# Patient Record
Sex: Female | Born: 1960 | Race: White | Hispanic: No | Marital: Married | Smoking: Never smoker
Health system: Southern US, Community
[De-identification: ages and names within clinical notes are randomized; demographics above are authoritative.]

## PROBLEM LIST (undated history)

## (undated) DIAGNOSIS — I10 Essential (primary) hypertension: Secondary | ICD-10-CM

---

## 2013-07-13 DIAGNOSIS — E669 Obesity, unspecified: Secondary | ICD-10-CM | POA: Insufficient documentation

## 2013-10-12 ENCOUNTER — Ambulatory Visit: Payer: Self-pay | Admitting: Gastroenterology

## 2013-12-28 ENCOUNTER — Ambulatory Visit: Payer: Self-pay | Admitting: General Practice

## 2014-12-08 DIAGNOSIS — E7800 Pure hypercholesterolemia, unspecified: Secondary | ICD-10-CM | POA: Insufficient documentation

## 2015-04-15 ENCOUNTER — Ambulatory Visit: Payer: Self-pay | Admitting: Physician Assistant

## 2015-04-15 ENCOUNTER — Encounter: Payer: Self-pay | Admitting: Physician Assistant

## 2015-04-15 VITALS — BP 140/90 | HR 87 | Temp 98.4°F

## 2015-04-15 DIAGNOSIS — J0120 Acute ethmoidal sinusitis, unspecified: Secondary | ICD-10-CM

## 2015-04-15 MED ORDER — PROMETHAZINE-DM 6.25-15 MG/5ML PO SYRP: 5.0000 mL | ORAL_SOLUTION | Freq: Four times a day (QID) | ORAL | Status: DC | PRN

## 2015-04-15 MED ORDER — SULFAMETHOXAZOLE-TRIMETHOPRIM 800-160 MG PO TABS: 1.0000 | ORAL_TABLET | Freq: Two times a day (BID) | ORAL | Status: DC

## 2015-04-15 NOTE — Progress Notes (Signed)
S. Patient c/o one week of sinus congestion, post nasal drainage, and non-productive cough.  Denies Fever/chill, states nausea adn vomiting. No diarrhea. O.  No acute distress,HEENT remarkable for maxillary sinus guarding, edematous nasal turbinates, and post nasal drainage. Non-productive cough. Neck supple, Lungs CTA, and heart RRR. A.  Maxillary sinusitis P.  Bactrim DS, and Phenergan DM. F/U PRN.

## 2015-05-13 ENCOUNTER — Ambulatory Visit: Payer: Self-pay | Admitting: Physician Assistant

## 2015-05-13 ENCOUNTER — Encounter: Payer: Self-pay | Admitting: Physician Assistant

## 2015-05-13 VITALS — BP 119/80 | HR 73 | Temp 98.0°F

## 2015-05-13 DIAGNOSIS — J069 Acute upper respiratory infection, unspecified: Secondary | ICD-10-CM

## 2015-05-13 MED ORDER — METHYLPREDNISOLONE 4 MG PO TBPK: ORAL_TABLET | ORAL | Status: DC

## 2015-05-13 MED ORDER — LEVOFLOXACIN 500 MG PO TABS: 500.0000 mg | ORAL_TABLET | Freq: Every day | ORAL | Status: DC

## 2015-05-13 NOTE — Progress Notes (Signed)
S: c/o continued cough and cold sx, was getting better but has relapsed, sick since before xmas, given septra then amoxil, zpacks don't usually work for her, denies fever/chills, cp/sob  O: vitals wnl, nad, tms dull b/l, nasal mucosa red and swollen, throat wnl, neck supple no lymph, lungs c t a, cv rrr, cough is dry and hacking  A: acute uri  P: levaquin 500mg  qd x 10d, medrol dose pack

## 2015-05-18 NOTE — Progress Notes (Signed)
Patient ID: Amanda Rowland, female   DOB: 02-29-1961, 55 y.o.   MRN: [id number] Patient called and express that she may be having an allergic reaction to Levaquin.  She said she developed red rashes all over he body.  Per Susan's order she was told to discontinue the medication. I added the medication to her list of allergies.

## 2016-01-19 ENCOUNTER — Ambulatory Visit: Payer: Self-pay | Admitting: Physician Assistant

## 2016-02-24 ENCOUNTER — Ambulatory Visit: Payer: Self-pay | Admitting: Physician Assistant

## 2016-02-24 ENCOUNTER — Encounter: Payer: Self-pay | Admitting: Physician Assistant

## 2016-02-24 VITALS — BP 120/80 | HR 64 | Temp 97.7°F | Ht 60.0 in | Wt 201.0 lb

## 2016-02-24 DIAGNOSIS — I10 Essential (primary) hypertension: Secondary | ICD-10-CM | POA: Insufficient documentation

## 2016-02-24 DIAGNOSIS — E6609 Other obesity due to excess calories: Secondary | ICD-10-CM

## 2016-02-24 MED ORDER — ATENOLOL 100 MG PO TABS: ORAL_TABLET | ORAL | 6 refills | Status: AC

## 2016-02-24 MED ORDER — PHENTERMINE HCL 37.5 MG PO CAPS: 37.5000 mg | ORAL_CAPSULE | ORAL | 0 refills | Status: DC

## 2016-02-24 MED ORDER — HYDROCHLOROTHIAZIDE 25 MG PO TABS: 25.0000 mg | ORAL_TABLET | Freq: Every day | ORAL | 6 refills | Status: AC

## 2016-02-24 NOTE — Addendum Note (Signed)
Addended by: Catha BrowEACON, Nikki Glanzer T on: 02/24/2016 09:41 AM   Modules accepted: Orders

## 2016-02-24 NOTE — Progress Notes (Signed)
S: here for yearly labs, ?if can have med refill on atenolol, is going to schedule [facility], no changes except weight gain, ? If we can help her suppress her appetite, no cp/sob/v/d/skin changes  O: vitals wnl, weight 201 lb; ent wnl, neck supple no lymph, lungs c t a, cv rrr  A: htn obesity  P: refill on atenolol and hctz, add phentermine 37.5mg  qd for 1 month, will do max of 3 months on the medication

## 2016-02-24 NOTE — Addendum Note (Signed)
Addended by: Faythe GheeFISHER, Mariea Mcmartin W on: 02/24/2016 09:01 AM   Modules accepted: Orders

## 2016-02-25 LAB — CMP12+LP+TP+TSH+6AC+CBC/D/PLT
ALK PHOS: 61 IU/L (ref 39–117)
ALT: 35 IU/L — ABNORMAL HIGH (ref 0–32)
AST: 22 IU/L (ref 0–40)
Albumin/Globulin Ratio: 1.4 (ref 1.2–2.2)
Albumin: 4 g/dL (ref 3.5–5.5)
BASOS: 1 %
BILIRUBIN TOTAL: 0.3 mg/dL (ref 0.0–1.2)
BUN / CREAT RATIO: 22 (ref 9–23)
BUN: 16 mg/dL (ref 6–24)
Basophils Absolute: 0.1 10*3/uL (ref 0.0–0.2)
CHLORIDE: 103 mmol/L (ref 96–106)
CHOL/HDL RATIO: 4.8 ratio — AB (ref 0.0–4.4)
CREATININE: 0.72 mg/dL (ref 0.57–1.00)
Calcium: 9.5 mg/dL (ref 8.7–10.2)
Cholesterol, Total: 213 mg/dL — ABNORMAL HIGH (ref 100–199)
EOS (ABSOLUTE): 0.2 10*3/uL (ref 0.0–0.4)
EOS: 3 %
ESTIMATED CHD RISK: 1.2 times avg. — AB (ref 0.0–1.0)
Free Thyroxine Index: 1.4 (ref 1.2–4.9)
GFR, EST AFRICAN AMERICAN: 109 mL/min/{1.73_m2} (ref >59)
GFR, EST NON AFRICAN AMERICAN: 95 mL/min/{1.73_m2} (ref >59)
GGT: 49 IU/L (ref 0–60)
GLUCOSE: 95 mg/dL (ref 65–99)
Globulin, Total: 2.8 g/dL (ref 1.5–4.5)
HDL: 44 mg/dL (ref >39)
HEMATOCRIT: 41.2 % (ref 34.0–46.6)
HEMOGLOBIN: 13.5 g/dL (ref 11.1–15.9)
IMMATURE GRANULOCYTES: 0 %
Immature Grans (Abs): 0 10*3/uL (ref 0.0–0.1)
Iron: 73 ug/dL (ref 27–159)
LDH: 210 IU/L (ref 119–226)
LDL CALC: 138 mg/dL — AB (ref 0–99)
LYMPHS ABS: 2.3 10*3/uL (ref 0.7–3.1)
Lymphs: 40 %
MCH: 28.5 pg (ref 26.6–33.0)
MCHC: 32.8 g/dL (ref 31.5–35.7)
MCV: 87 fL (ref 79–97)
Monocytes Absolute: 0.5 10*3/uL (ref 0.1–0.9)
Monocytes: 9 %
Neutrophils Absolute: 2.7 10*3/uL (ref 1.4–7.0)
Neutrophils: 47 %
Phosphorus: 3.8 mg/dL (ref 2.5–4.5)
Platelets: 317 10*3/uL (ref 150–379)
Potassium: 5.1 mmol/L (ref 3.5–5.2)
RBC: 4.73 x10E6/uL (ref 3.77–5.28)
RDW: 13.4 % (ref 12.3–15.4)
SODIUM: 144 mmol/L (ref 134–144)
T3 Uptake Ratio: 25 % (ref 24–39)
T4, Total: 5.4 ug/dL (ref 4.5–12.0)
TOTAL PROTEIN: 6.8 g/dL (ref 6.0–8.5)
TSH: 1.66 u[IU]/mL (ref 0.450–4.500)
Triglycerides: 157 mg/dL — ABNORMAL HIGH (ref 0–149)
URIC ACID: 5 mg/dL (ref 2.5–7.1)
VLDL CHOLESTEROL CAL: 31 mg/dL (ref 5–40)
WBC: 5.7 10*3/uL (ref 3.4–10.8)

## 2016-02-25 LAB — VITAMIN D 25 HYDROXY (VIT D DEFICIENCY, FRACTURES): VIT D 25 HYDROXY: 28.6 ng/mL — AB (ref 30.0–100.0)

## 2016-02-25 LAB — VITAMIN B12: VITAMIN B 12: 511 pg/mL (ref 211–946)

## 2016-03-01 LAB — HGB A1C W/O EAG: HEMOGLOBIN A1C: 5.6 % (ref 4.8–5.6)

## 2016-03-01 LAB — SPECIMEN STATUS REPORT

## 2016-03-08 ENCOUNTER — Encounter: Payer: Self-pay | Admitting: Physician Assistant

## 2016-03-08 ENCOUNTER — Other Ambulatory Visit: Payer: Self-pay | Admitting: Physician Assistant

## 2016-03-08 MED ORDER — VITAMIN D (ERGOCALCIFEROL) 1.25 MG (50000 UNIT) PO CAPS: 50000.0000 [IU] | ORAL_CAPSULE | ORAL | 3 refills | Status: AC

## 2016-03-08 NOTE — Telephone Encounter (Signed)
sent 

## 2016-03-08 NOTE — Progress Notes (Signed)
Sent rx for vitamin d to [facility] at pt's request

## 2016-11-28 DIAGNOSIS — R928 Other abnormal and inconclusive findings on diagnostic imaging of breast: Secondary | ICD-10-CM | POA: Insufficient documentation

## 2017-03-25 ENCOUNTER — Ambulatory Visit: Payer: Self-pay | Admitting: Physician Assistant

## 2017-03-25 ENCOUNTER — Encounter: Payer: Self-pay | Admitting: Physician Assistant

## 2017-03-25 VITALS — BP 130/80 | HR 79 | Temp 98.1°F | Resp 16

## 2017-03-25 DIAGNOSIS — J209 Acute bronchitis, unspecified: Secondary | ICD-10-CM

## 2017-03-25 MED ORDER — PSEUDOEPH-BROMPHEN-DM 30-2-10 MG/5ML PO SYRP: 5.0000 mL | ORAL_SOLUTION | Freq: Four times a day (QID) | ORAL | 0 refills | Status: DC | PRN

## 2017-03-25 MED ORDER — SULFAMETHOXAZOLE-TRIMETHOPRIM 800-160 MG PO TABS: 1.0000 | ORAL_TABLET | Freq: Two times a day (BID) | ORAL | 0 refills | Status: DC

## 2017-03-25 MED ORDER — ALBUTEROL SULFATE HFA 108 (90 BASE) MCG/ACT IN AERS: 2.0000 | INHALATION_SPRAY | Freq: Four times a day (QID) | RESPIRATORY_TRACT | 0 refills | Status: DC | PRN

## 2017-03-25 NOTE — Progress Notes (Signed)
   Subjective: Cough     Patient ID: Amanda MoundKristy L Rowland, female    DOB: 19-Mar-1961, 56 y.o.   MRN: [id number]  HPI Patient complaining of greenish thick productive cough for 3 days. Patient denies nasal congestion or rhinorrhea. Patient denies sore throat. Patient denies fever/chills. No palliative measures for complaint.   Review of Systems  Hypertension and obesity.    Objective:   Physical Exam HEENT unremarkable. Neck is supple without adenopathy. Patient has bilateral inspiratory Rales. Heart is regular rate and rhythm.       Assessment & Plan: Bronchitis   Patient prescribed Bactrim DS and Brown felt DM. Advised to follow-up with PCP if no improvement in 3-5 days.

## 2017-04-14 ENCOUNTER — Other Ambulatory Visit: Payer: Self-pay

## 2017-04-14 ENCOUNTER — Emergency Department: Payer: Managed Care, Other (non HMO)

## 2017-04-14 ENCOUNTER — Emergency Department
Admission: EM | Admit: 2017-04-14 | Discharge: 2017-04-14 | Disposition: A | Discharge status: Home / Self Care | Payer: Managed Care, Other (non HMO) | Attending: Emergency Medicine | Admitting: Emergency Medicine

## 2017-04-14 ENCOUNTER — Encounter: Payer: Self-pay | Admitting: Emergency Medicine

## 2017-04-14 DIAGNOSIS — I10 Essential (primary) hypertension: Secondary | ICD-10-CM | POA: Insufficient documentation

## 2017-04-14 DIAGNOSIS — S8001XA Contusion of right knee, initial encounter: Secondary | ICD-10-CM | POA: Diagnosis not present

## 2017-04-14 DIAGNOSIS — X509XXA Other and unspecified overexertion or strenuous movements or postures, initial encounter: Secondary | ICD-10-CM | POA: Insufficient documentation

## 2017-04-14 DIAGNOSIS — Y92009 Unspecified place in unspecified non-institutional (private) residence as the place of occurrence of the external cause: Secondary | ICD-10-CM | POA: Insufficient documentation

## 2017-04-14 DIAGNOSIS — Z79899 Other long term (current) drug therapy: Secondary | ICD-10-CM | POA: Insufficient documentation

## 2017-04-14 DIAGNOSIS — M7041 Prepatellar bursitis, right knee: Secondary | ICD-10-CM

## 2017-04-14 DIAGNOSIS — Y999 Unspecified external cause status: Secondary | ICD-10-CM | POA: Diagnosis not present

## 2017-04-14 DIAGNOSIS — S8991XA Unspecified injury of right lower leg, initial encounter: Secondary | ICD-10-CM | POA: Diagnosis present

## 2017-04-14 DIAGNOSIS — Y9301 Activity, walking, marching and hiking: Secondary | ICD-10-CM | POA: Insufficient documentation

## 2017-04-14 HISTORY — DX: Essential (primary) hypertension: I10

## 2017-04-14 MED ORDER — ACETAMINOPHEN-CODEINE #3 300-30 MG PO TABS: 1.0000 | ORAL_TABLET | Freq: Three times a day (TID) | ORAL | 0 refills | Status: DC | PRN

## 2017-04-14 MED ORDER — ACETAMINOPHEN-CODEINE #3 300-30 MG PO TABS
1.0000 | ORAL_TABLET | Freq: Once | ORAL | Status: AC
  Administered 2017-04-14: 1 via ORAL
  Filled 2017-04-14: qty 1

## 2017-04-14 NOTE — Discharge Instructions (Signed)
Your x-ray is negative for fracture or dislocation. You do appear to have a traumatic bursitis. Take the pain medicine along with OTC ibuprofen for inflammation. Rest with the foot elevated when seated. Apply ice to reduce swelling. Follow-up with Ortho for continued symptoms.

## 2017-04-14 NOTE — ED Triage Notes (Signed)
Pt presents to ED via POV c/o R knee pain after fall. Pt states she slipped on hardwood floor. Bruising and swelling noted to R knee. Pt states she was able to walk on it at first but now pain is too much.

## 2017-04-15 NOTE — ED Provider Notes (Signed)
[facility] Emergency Department Provider Note ____________________________________________  Time seen: [id number]  I have reviewed the triage vital signs and the nursing notes.  HISTORY  Chief Complaint  Knee Injury  HPI Amanda Rowland is a 56 y.o. female sent to the ED accompanied by her husband, for evaluation of right knee pain after fall last night.  Patient describes she slipped on a hardwood floors at home, while wearing a pair of low heeled shoes.  She describes falling directly onto her right knee.  Since that time she has had increased bruising, swelling, and stiffness to the right knee.  She describes pain with attempts to ambulate.  She denies any other injury at this time.  Past Medical History:  Diagnosis Date  . Hypertension     Patient Active Problem List   Diagnosis Date Noted  . Hypertension 02/24/2016  . Pure hypercholesterolemia 12/08/2014  . Obesity 07/13/2013    History reviewed. No pertinent surgical history.  Prior to Admission medications   Medication Sig Start Date End Date Taking? Authorizing Provider  acetaminophen-codeine (TYLENOL #3) 300-30 MG tablet Take 1 tablet by mouth 3 (three) times daily as needed for moderate pain. 04/14/17   Farhana Fellows, Charlesetta IvoryJenise V Bacon, PA-C  albuterol (PROVENTIL HFA;VENTOLIN HFA) 108 (90 Base) MCG/ACT inhaler Inhale 2 puffs into the lungs every 6 (six) hours as needed for wheezing or shortness of breath. 03/25/17   Joni ReiningSmith, Ronald K, PA-C  atenolol (TENORMIN) 100 MG tablet TAKE 1 TABLET (100 MG TOTAL) BY MOUTH ONCE DAILY. 02/24/16   Fisher, Roselyn BeringSusan W, PA-C  brompheniramine-pseudoephedrine-DM 30-2-10 MG/5ML syrup Take 5 mLs by mouth 4 (four) times daily as needed. 03/25/17   Joni ReiningSmith, Ronald K, PA-C  hydrochlorothiazide (HYDRODIURIL) 25 MG tablet Take 1 tablet (25 mg total) by mouth daily. 02/24/16   Fisher, Roselyn BeringSusan W, PA-C  phentermine 37.5 MG capsule Take 1 capsule (37.5 mg total) by mouth every morning. 02/24/16    Fisher, Roselyn BeringSusan W, PA-C  sulfamethoxazole-trimethoprim (BACTRIM DS,SEPTRA DS) 800-160 MG tablet Take 1 tablet by mouth 2 (two) times daily. 03/25/17   Joni ReiningSmith, Ronald K, PA-C  Vitamin D, Ergocalciferol, (DRISDOL) 50000 units CAPS capsule Take 1 capsule (50,000 Units total) by mouth every 7 (seven) days. 03/08/16   Faythe GheeFisher, Susan W, PA-C    Allergies Influenza vaccines; Levaquin [levofloxacin in d5w]; and Other  History reviewed. No pertinent family history.  Social History Social History   Tobacco Use  . Smoking status: Never Smoker  . Smokeless tobacco: Never Used  Substance Use Topics  . Alcohol use: No    Alcohol/week: 0.0 oz    Frequency: Never  . Drug use: Not on file    Review of Systems  Constitutional: Negative for fever. Cardiovascular: Negative for chest pain. Respiratory: Negative for shortness of breath. Musculoskeletal: Negative for back pain.  Right knee pain as above. Skin: Negative for rash. Neurological: Negative for headaches, focal weakness or numbness. ____________________________________________  PHYSICAL EXAM:  VITAL SIGNS: ED Triage Vitals  Enc Vitals Group     BP 04/14/17 1837 (!) 152/81     Pulse Rate 04/14/17 1837 70     Resp 04/14/17 1837 16     Temp 04/14/17 1837 98.6 F (37 C)     Temp Source 04/14/17 1837 Oral     SpO2 04/14/17 1837 99 %     Weight 04/14/17 1838 195 lb (88.5 kg)     Height 04/14/17 1838 5' (1.524 m)     Head Circumference --  Peak Flow --      Pain Score 04/14/17 1841 5     Pain Loc --      Pain Edu? --      Excl. in GC? --     Constitutional: Alert and oriented. Well appearing and in no distress. Head: Normocephalic and atraumatic. Cardiovascular: Normal rate, regular rhythm. Normal distal pulses. Respiratory: Normal respiratory effort. No wheezes/rales/rhonchi. Musculoskeletal: Knee with obvious prepatellar bruising and ecchymosis.  Patient also has edema noted to the inferior patella fossa.  Her exam shows  normal extension range but decreased flexion secondary to pain and stiffness.  No popliteal space fullness or tenderness is noted.  No calf or Achilles tenderness is appreciated.  Nontender with normal range of motion in all extremities.  Neurologic: Gait not tested secondary to pain.  Normal speech and language. No gross focal neurologic deficits are appreciated. Skin:  Skin is warm, dry and intact. No rash noted. ____________________________________________   RADIOLOGY  Right Knee IMPRESSION: No fracture or dislocation. No joint effusion. No evident Arthropathy  I, Tallan Sandoz, Charlesetta IvoryJenise V Bacon, personally viewed and evaluated these images (plain radiographs) as part of my medical decision making, as well as reviewing the written report by the radiologist. ____________________________________________  PROCEDURES  Tylenol #3 i PO  .Splint Application Date/Time: 04/15/2017 12:52 AM Performed by: Cyndra NumbersWheeler, Kristin D, NT Authorized by: Lissa HoardMenshew, Adrienne Trombetta V Bacon, PA-C   Consent:    Consent obtained:  Verbal   Consent given by:  Patient   Alternatives discussed:  No treatment Pre-procedure details:    Sensation:  Normal Procedure details:    Laterality:  Right   Location:  Knee   Splint type:  Knee immobilizer   Supplies:  Elastic bandage and prefabricated splint Post-procedure details:    Pain:  Improved   Sensation:  Normal   Patient tolerance of procedure:  Tolerated well, no immediate complications  ____________________________________________  INITIAL IMPRESSION / ASSESSMENT AND PLAN / ED COURSE  Patient with ED evaluation of right knee pain and swelling status post mechanical fall.  Patient's exam is overall benign.  She does have symptoms that are concerning for a possible traumatic bursitis.  She has significant swelling and ecchymosis to the infrapatellar fossa.  Patient is reassured by her negative x-ray at this time.  She is placed in a knee immobilizer and given crutches to  ambulate.  She will be referred to orthopedics for ongoing management.  Prescription for Tylenol 3 provided for her moderate to severe pain.  She will dose ibuprofen for nondrowsy anti-inflammatory pain relief.  A work note is provided for 2 days as requested. ____________________________________________  FINAL CLINICAL IMPRESSION(S) / ED DIAGNOSES  Final diagnoses:  Contusion of right knee, initial encounter  Prepatellar bursitis of right knee      Lissa HoardMenshew, Dina Warbington V Bacon, PA-C 04/15/17 0054    Arnaldo NatalMalinda, Paul F, MD 04/19/17 [phone]

## 2017-05-15 ENCOUNTER — Ambulatory Visit: Payer: Self-pay | Admitting: Registered Nurse

## 2017-05-15 VITALS — BP 120/80 | HR 80 | Temp 98.6°F | Resp 16

## 2017-05-15 DIAGNOSIS — J0101 Acute recurrent maxillary sinusitis: Secondary | ICD-10-CM

## 2017-05-15 MED ORDER — DOXYCYCLINE HYCLATE 100 MG PO TABS: 100.0000 mg | ORAL_TABLET | Freq: Two times a day (BID) | ORAL | 0 refills | Status: DC

## 2017-05-15 MED ORDER — BENZONATATE 200 MG PO CAPS: 200.0000 mg | ORAL_CAPSULE | Freq: Two times a day (BID) | ORAL | 0 refills | Status: DC | PRN

## 2017-05-15 MED ORDER — SALINE SPRAY 0.65 % NA SOLN: 2.0000 | NASAL | 0 refills | Status: AC

## 2017-05-15 MED ORDER — PREDNISONE 10 MG (21) PO TBPK: ORAL_TABLET | ORAL | 0 refills | Status: DC

## 2017-05-15 NOTE — Progress Notes (Signed)
Subjective:    Patient ID: Amanda Rowland, female    DOB: [birth year], 57 y.o.   MRN: [id number]  56y/o established caucasian female here for recurrent sinusitis, headache left sided.  Started mucinex DM getting flushed in face and headache thinks either sinus infection or related to medication.  Using inhaler twice a day for wheezing.  Asthma as a child has required oral steroids in the past.  Feeling like she may need some now.  Felt better for a a week or two and then worsened doesn't like to use nose sprays worried about addiction.      Review of Systems  Constitutional: Negative for activity change, appetite change, chills, diaphoresis, fatigue, fever and unexpected weight change.  HENT: Positive for congestion, postnasal drip, rhinorrhea, sinus pressure, sinus pain and sore throat. Negative for dental problem, drooling, ear discharge, ear pain, facial swelling, hearing loss, mouth sores, nosebleeds, sneezing, tinnitus, trouble swallowing and voice change.   Eyes: Negative for photophobia, pain, discharge, redness, itching and visual disturbance.  Respiratory: Positive for cough and wheezing. Negative for choking, chest tightness, shortness of breath and stridor.   Cardiovascular: Negative for chest pain, palpitations and leg swelling.  Gastrointestinal: Negative for abdominal distention, abdominal pain, blood in stool, constipation, diarrhea, nausea and vomiting.  Endocrine: Negative for cold intolerance and heat intolerance.  Genitourinary: Negative for difficulty urinating, dysuria and hematuria.  Musculoskeletal: Negative for arthralgias, back pain, gait problem, joint swelling, myalgias, neck pain and neck stiffness.  Skin: Negative for color change, pallor, rash and wound.  Allergic/Immunologic: Positive for environmental allergies. Negative for food allergies.  Neurological: Positive for headaches. Negative for dizziness, tremors, seizures, syncope, facial asymmetry, speech  difficulty, weakness, light-headedness and numbness.  Hematological: Negative for adenopathy. Does not bruise/bleed easily.  Psychiatric/Behavioral: Negative for agitation, behavioral problems, confusion and sleep disturbance.       Objective:   Physical Exam  Constitutional: She is oriented to person, place, and time. Vital signs are normal. She appears well-developed and well-nourished. She is active and cooperative.  Non-toxic appearance. She does not have a sickly appearance. She appears ill. No distress.  HENT:  Head: Normocephalic and atraumatic.  Right Ear: Hearing, external ear and ear canal normal. A middle ear effusion is present.  Left Ear: Hearing, external ear and ear canal normal. A middle ear effusion is present.  Nose: Mucosal edema and rhinorrhea present. No nose lacerations, sinus tenderness, nasal deformity, septal deviation or nasal septal hematoma. No epistaxis.  No foreign bodies. Right sinus exhibits maxillary sinus tenderness. Right sinus exhibits no frontal sinus tenderness. Left sinus exhibits maxillary sinus tenderness. Left sinus exhibits no frontal sinus tenderness.  Mouth/Throat: Uvula is midline and mucous membranes are normal. Mucous membranes are not pale, not dry and not cyanotic. She does not have dentures. No oral lesions. No trismus in the jaw. Normal dentition. No dental abscesses, uvula swelling, lacerations or dental caries. Posterior oropharyngeal edema and posterior oropharyngeal erythema present. No oropharyngeal exudate or tonsillar abscesses.  Pressure with palpation frontal; maxillary left greater than right tender and upper teeth pain; cobblestoning posterior pharynx; bilateral TMs air fluid level clear; bilateral nasal turbinates edema/erythema clear discharge; bilateral allergic shiners  Eyes: Conjunctivae, EOM and lids are normal. Pupils are equal, round, and reactive to light. Right eye exhibits no chemosis, no discharge, no exudate and no hordeolum.  No foreign body present in the right eye. Left eye exhibits no chemosis, no discharge, no exudate and no hordeolum. No foreign body present  in the left eye. Right conjunctiva is not injected. Right conjunctiva has no hemorrhage. Left conjunctiva is not injected. Left conjunctiva has no hemorrhage. No scleral icterus. Right eye exhibits normal extraocular motion and no nystagmus. Left eye exhibits normal extraocular motion and no nystagmus. Right pupil is round and reactive. Left pupil is round and reactive. Pupils are equal.  Neck: Trachea normal, normal range of motion and phonation normal. Neck supple. No tracheal tenderness and no muscular tenderness present. No neck rigidity. No tracheal deviation, no edema, no erythema and normal range of motion present. No thyroid mass and no thyromegaly present.  Cardiovascular: Normal rate, regular rhythm, S1 normal, S2 normal, normal heart sounds and intact distal pulses. PMI is not displaced. Exam reveals no gallop and no friction rub.  No murmur heard. Pulmonary/Chest: Effort normal. No accessory muscle usage or stridor. No respiratory distress. She has no decreased breath sounds. She has wheezes in the right middle field and the left middle field. She has no rhonchi. She has no rales. She exhibits no tenderness.  Intermittent rare expiratory wheeze; spoke full sentences without difficulty; nonproductive intermittent cough in exam room  Abdominal: Soft. Normal appearance. She exhibits no distension, no fluid wave and no ascites. There is no rigidity and no guarding.  Musculoskeletal: Normal range of motion. She exhibits no edema or tenderness.       Right shoulder: Normal.       Left shoulder: Normal.       Right elbow: Normal.      Left elbow: Normal.       Right hip: Normal.       Left hip: Normal.       Right knee: Normal.       Left knee: Normal.       Cervical back: Normal.       Thoracic back: Normal.       Lumbar back: Normal.       Right hand:  Normal.       Left hand: Normal.  Lymphadenopathy:       Head (right side): No submental, no submandibular, no tonsillar, no preauricular, no posterior auricular and no occipital adenopathy present.       Head (left side): No submental, no submandibular, no tonsillar, no preauricular, no posterior auricular and no occipital adenopathy present.    She has no cervical adenopathy.       Right cervical: No superficial cervical, no deep cervical and no posterior cervical adenopathy present.      Left cervical: No superficial cervical, no deep cervical and no posterior cervical adenopathy present.  Neurological: She is alert and oriented to person, place, and time. She has normal strength. She is not disoriented. She displays no atrophy and no tremor. No cranial nerve deficit or sensory deficit. She exhibits normal muscle tone. She displays no seizure activity. Coordination and gait normal. GCS eye subscore is 4. GCS verbal subscore is 5. GCS motor subscore is 6.  On/off exam table without difficulty; gait sure and steady in hallway  Skin: Skin is warm, dry and intact. No abrasion, no bruising, no burn, no ecchymosis, no laceration, no lesion, no petechiae and no rash noted. She is not diaphoretic. No cyanosis or erythema. No pallor. Nails show no clubbing.  Psychiatric: She has a normal mood and affect. Her speech is normal and behavior is normal. Judgment and thought content normal. Cognition and memory are normal.  Vitals reviewed.         Assessment &  Plan:  A-acute bronchitis and recurrent maxillary sinusitis  P-Rx tessalon pearles 200mg  po TID prn cough #20 RF0, Start if no improvement after prednisone taper Doxycycline 100mg  po BID x 10 days #20 RF0 dispensed from [facility]. Discussed to use sunscreen/protective clothing as increased risk of sunburn and most common side effect stomach upset take medication with food. Cough lozenges po q2h prn cough given 8 UD from clinic stock.  Prednisone taper  10mg  (60/50/40/30/20/10mg ) po daily with breakfast #21 RF0 dispensed from [facility].  Discussed possible side effects increased/decreased appetite, difficulty sleeping, increased blood sugar, increased blood pressure and heart rate.  Albuterol MDI 1-2 puffs po q4-6h prn protracted cough/wheeze #1 RF0 side effect increased heart rate. Bronchitis simple, community acquired, may have started as viral (probably respiratory syncytial, parainfluenza, influenza, or adenovirus), but now evidence of acute purulent bronchitis with resultant bronchial edema and mucus formation.  Viruses are the most common cause of bronchial inflammation in otherwise healthy adults with acute bronchitis.  The appearance of sputum is not predictive of whether a bacterial infection is present.  Purulent sputum is most often caused by viral infections.  There are a small portion of those caused by non-viral agents being Mycoplama pneumonia.  Microscopic examination or C&S of sputum in the healthy adult with acute bronchitis is generally not helpful (usually negative or normal respiratory flora) other considerations being cough from upper respiratory tract infections, sinusitis or allergic syndromes (mild asthma or viral pneumonia).  Differential Diagnoses:  reactive airway disease (asthma, allergic aspergillosis (eosinophilia), chronic bronchitis, respiratory infection (sinusitis, common cold, pneumonia), congestive heart failure, reflux esophagitis, bronchogenic tumor, aspiration syndromes and/or exposure to pulmonary irritants/smoke.  I will order Doxycycline 100mg  two times a day for ten days for possible Mycoplamsa.  Without high fever, severe dyspnea, lack of physical findings or other risk factors, I will hold on a chest radiograph and CBC at this time.  I discussed that approximately 50% of patients with acute bronchitis have a cough that lasts up to three weeks, and 25% for over a month.  Tylenol 500mg  one to two tablets every four to  six hours as needed for fever or myalgias.  No aspirin. Exitcare handout on bronchitis and inhaler use given to patient.  ER if hemopthysis, SOB, worst chest pain of life.   Patient instructed to follow up in one week or sooner if symptoms worsen.  Patient verbalized agreement and understanding of treatment plan.  P2:  hand washing and cover cough  saline 2 sprays each nostril q2h wa prn congestion. Electronic Rx Doxycycline 100mg  po BID x 10 days #20 RF0  Discussed hypokalemia possible increase bananas, potatoes, juice while on temporary medications.  Electronic Rx given.  Denied personal or family history of ENT cancer.  Shower BID especially prior to bed. No evidence of systemic bacterial infection, non toxic and well hydrated.  I do not see where any further testing or imaging is necessary at this time.   I will suggest supportive care, rest, good hygiene and encourage the patient to take adequate fluids.  The patient is to return to clinic or EMERGENCY ROOM if symptoms worsen or change significantly.  Exitcare handout on sinusitis and sinus rinse given to patient.  Patient verbalized agreement and understanding of treatment plan and had no further questions at this time.   P2:  Hand washing and cover cough

## 2017-05-15 NOTE — Patient Instructions (Signed)
Sinusitis, Adult  Sinusitis is soreness and inflammation of your sinuses. Sinuses are hollow spaces in the bones around your face. Your sinuses are located:  · Around your eyes.  · In the middle of your forehead.  · Behind your nose.  · In your cheekbones.    Your sinuses and nasal passages are lined with a stringy fluid (mucus). Mucus normally drains out of your sinuses. When your nasal tissues become inflamed or swollen, the mucus can become trapped or blocked so air cannot flow through your sinuses. This allows bacteria, viruses, and funguses to grow, which leads to infection.  Sinusitis can develop quickly and last for 7?10 days (acute) or for more than 12 weeks (chronic). Sinusitis often develops after a cold.  What are the causes?  This condition is caused by anything that creates swelling in the sinuses or stops mucus from draining, including:  · Allergies.  · Asthma.  · Bacterial or viral infection.  · Abnormally shaped bones between the nasal passages.  · Nasal growths that contain mucus (nasal polyps).  · Narrow sinus openings.  · Pollutants, such as chemicals or irritants in the air.  · A foreign object stuck in the nose.  · A fungal infection. This is rare.    What increases the risk?  The following factors may make you more likely to develop this condition:  · Having allergies or asthma.  · Having had a recent cold or respiratory tract infection.  · Having structural deformities or blockages in your nose or sinuses.  · Having a weak immune system.  · Doing a lot of swimming or diving.  · Overusing nasal sprays.  · Smoking.    What are the signs or symptoms?  The main symptoms of this condition are pain and a feeling of pressure around the affected sinuses. Other symptoms include:  · Upper toothache.  · Earache.  · Headache.  · Bad breath.  · Decreased sense of smell and taste.  · A cough that may get worse at night.  · Fatigue.  · Fever.   · Thick drainage from your nose. The drainage is often green and it may contain pus (purulent).  · Stuffy nose or congestion.  · Postnasal drip. This is when extra mucus collects in the throat or back of the nose.  · Swelling and warmth over the affected sinuses.  · Sore throat.  · Sensitivity to light.    How is this diagnosed?  This condition is diagnosed based on symptoms, a medical history, and a physical exam. To find out if your condition is acute or chronic, your health care provider may:  · Look in your nose for signs of nasal polyps.  · Tap over the affected sinus to check for signs of infection.  · View the inside of your sinuses using an imaging device that has a light attached (endoscope).    If your health care provider suspects that you have chronic sinusitis, you may also:  · Be tested for allergies.  · Have a sample of mucus taken from your nose (nasal culture) and checked for bacteria.  · Have a mucus sample examined to see if your sinusitis is related to an allergy.    If your sinusitis does not respond to treatment and it lasts longer than 8 weeks, you may have an MRI or CT scan to check your sinuses. These scans also help to determine how severe your infection is.  In rare cases, a bone   biopsy may be done to rule out more serious types of fungal sinus disease.  How is this treated?  Treatment for sinusitis depends on the cause and whether your condition is chronic or acute. If a virus is causing your sinusitis, your symptoms will go away on their own within 10 days. You may be given medicines to relieve your symptoms, including:  · Topical nasal decongestants. They shrink swollen nasal passages and let mucus drain from your sinuses.  · Antihistamines. These drugs block inflammation that is triggered by allergies. This can help to ease swelling in your nose and sinuses.  · Topical nasal corticosteroids. These are nasal sprays that ease inflammation and swelling in your nose and sinuses.   · Nasal saline washes. These rinses can help to get rid of thick mucus in your nose.    If your condition is caused by bacteria, you will be given an antibiotic medicine. If your condition is caused by a fungus, you will be given an antifungal medicine.  Surgery may be needed to correct underlying conditions, such as narrow nasal passages. Surgery may also be needed to remove polyps.  Follow these instructions at home:  Medicines  · Take, use, or apply over-the-counter and prescription medicines only as told by your health care provider. These may include nasal sprays.  · If you were prescribed an antibiotic medicine, take it as told by your health care provider. Do not stop taking the antibiotic even if you start to feel better.  Hydrate and Humidify  · Drink enough water to keep your urine clear or pale yellow. Staying hydrated will help to thin your mucus.  · Use a cool mist humidifier to keep the humidity level in your home above 50%.  · Inhale steam for 10–15 minutes, 3–4 times a day or as told by your health care provider. You can do this in the bathroom while a hot shower is running.  · Limit your exposure to cool or dry air.  Rest  · Rest as much as possible.  · Sleep with your head raised (elevated).  · Make sure to get enough sleep each night.  General instructions  · Apply a warm, moist washcloth to your face 3–4 times a day or as told by your health care provider. This will help with discomfort.  · Wash your hands often with soap and water to reduce your exposure to viruses and other germs. If soap and water are not available, use hand sanitizer.  · Do not smoke. Avoid being around people who are smoking (secondhand smoke).  · Keep all follow-up visits as told by your health care provider. This is important.  Contact a health care provider if:  · You have a fever.  · Your symptoms get worse.  · Your symptoms do not improve within 10 days.  Get help right away if:  · You have a severe headache.   · You have persistent vomiting.  · You have pain or swelling around your face or eyes.  · You have vision problems.  · You develop confusion.  · Your neck is stiff.  · You have trouble breathing.  This information is not intended to replace advice given to you by your health care provider. Make sure you discuss any questions you have with your health care provider.  Document Released: 04/16/2005 Document Revised: 12/11/2015 Document Reviewed: 02/09/2015  [employer]vier Interactive Patient Education © 2018 [employer]      Sinus Rinse  What is   a sinus rinse?  A sinus rinse is a simple home treatment that is used to rinse your sinuses with a sterile mixture of salt and water (saline solution). Sinuses are air-filled spaces in your skull behind the bones of your face and forehead that open into your nasal cavity.  You will use the following:  · Saline solution.  · Neti pot or spray bottle. This releases the saline solution into your nose and through your sinuses. Neti pots and spray bottles can be purchased at your local pharmacy, a health food store, or online.    When would I do a sinus rinse?  A sinus rinse can help to clear mucus, dirt, dust, or pollen from the nasal cavity. You may do a sinus rinse when you have a cold, a virus, nasal allergy symptoms, a sinus infection, or stuffiness in the nose or sinuses.  If you are considering a sinus rinse:  · Ask your child's health care provider before performing a sinus rinse on your child.  · Do not do a sinus rinse if you have had ear or nasal surgery, ear infection, or blocked ears.    How do I do a sinus rinse?  · Wash your hands.  · Disinfect your device according to the directions provided and then dry it.  · Use the solution that comes with your device or one that is sold separately in stores. Follow the mixing directions on the package.  · Fill your device with the amount of saline solution as directed by the device instructions.   · Stand over a sink and tilt your head sideways over the sink.  · Place the spout of the device in your upper nostril (the one closer to the ceiling).  · Gently pour or squeeze the saline solution into the nasal cavity. The liquid should drain to the lower nostril if you are not overly congested.  · Gently blow your nose. Blowing too hard may cause ear pain.  · Repeat in the other nostril.  · Clean and rinse your device with clean water and then air-dry it.  Are there risks of a sinus rinse?  Sinus rinse is generally very safe and effective. However, there are a few risks, which include:  · A burning sensation in the sinuses. This may happen if you do not make the saline solution as directed. Make sure to follow all directions when making the saline solution.  · Infection from contaminated water. This is rare, but possible.  · Nasal irritation.    This information is not intended to replace advice given to you by your health care provider. Make sure you discuss any questions you have with your health care provider.  Document Released: 11/11/2013 Document Revised: 03/13/2016 Document Reviewed: 09/01/2013  [employer]vier Interactive Patient Education © 2017 [employer]

## 2017-06-26 ENCOUNTER — Ambulatory Visit: Payer: Self-pay

## 2017-11-04 ENCOUNTER — Ambulatory Visit: Payer: Self-pay | Admitting: Family Medicine

## 2017-11-04 VITALS — BP 139/86 | HR 68 | Temp 98.9°F | Resp 16

## 2017-11-04 DIAGNOSIS — R05 Cough: Secondary | ICD-10-CM

## 2017-11-04 DIAGNOSIS — J4520 Mild intermittent asthma, uncomplicated: Secondary | ICD-10-CM

## 2017-11-04 DIAGNOSIS — R059 Cough, unspecified: Secondary | ICD-10-CM

## 2017-11-04 MED ORDER — ALBUTEROL SULFATE HFA 108 (90 BASE) MCG/ACT IN AERS: 2.0000 | INHALATION_SPRAY | Freq: Four times a day (QID) | RESPIRATORY_TRACT | 0 refills | Status: DC | PRN

## 2017-11-04 MED ORDER — BENZONATATE 200 MG PO CAPS: 200.0000 mg | ORAL_CAPSULE | Freq: Every evening | ORAL | 0 refills | Status: AC | PRN

## 2017-11-04 NOTE — Progress Notes (Signed)
Subjective: Cough and congestion     Amanda MoundKristy L Rowland is a 57 y.o. female who presents for evaluation of productive and nonproductive cough with green sputum, sore throat, nasal congestion, and mild facial pressure for 1 day.  Patient reports a history of asthma but that she only has to use her albuterol inhaler with upper respiratory infections and seasonal allergies.  Patient reports using this less than 2 times a month.  Patient denies any history of hospitalization or intubation for asthma.  Patient reports mild difficulty with taking a deep breath for 1 day, consistent with asthma symptoms in the past.  Patient has used her albuterol inhaler once since the onset of her symptoms, which has improved this.  Patient denies severe symptoms or worsening of symptoms.  Patient reports her granddaughter had similar symptoms last week and was sharing a drink with her. Treatment to date: Coricidin HBP.  Denies rash, nausea, vomiting, diarrhea, SOB, wheezing, chest or back pain, ear pain,  difficulty swallowing, confusion, purulent nasal discharge, anosmia/hyposmia, headache, body aches, fatigue, fever, chills, severe symptoms or initial improvement and then worsening of symptoms. History of smoking, asthma, COPD: Positive for asthma only. History of recurrent sinus and/or lung infections: Negative. Medical history: Hypertension and allergic rhinitis.  Review of Systems Pertinent items noted in HPI and remainder of comprehensive ROS otherwise negative.     Objective:   Physical Exam General: Awake, alert, and oriented. No acute distress. Well developed, hydrated and nourished. Appears stated age. Nontoxic appearance.  HEENT:  PND noted.  Mild erythema to posterior oropharynx.  No edema or exudates of pharynx or tonsils. No erythema or bulging of TM.  Mild erythema/edema to nasal mucosa. Sinuses nontender. Supple neck without adenopathy. Cardiac: Heart rate and rhythm are normal. No murmurs, gallops, or rubs  are auscultated. S1 and S2 are heard and are of normal intensity.  Respiratory: No signs of respiratory distress. Lungs clear. No tachypnea. Able to speak in full sentences without dyspnea. Nonlabored respirations.  Skin: Skin is warm, dry and intact. Appropriate color for ethnicity. No cyanosis noted.    Assessment:    viral upper respiratory illness   Plan:    Discussed diagnosis and treatment of URI. Discussed the importance of avoiding unnecessary antibiotic therapy. Suggested symptomatic OTC remedies. Nasal saline spray for congestion.   Prescribed Tessalon Perles to use as needed at night for cough.  Discussed side/adverse effects.  Patient has taken this in the past and tolerated it well. Patient unsure how much of her albuterol inhaler she has left, provided her with 1 refill.  Advised her to use this as needed. Discussed red flag symptoms and circumstances with which to seek medical care.   New Prescriptions   BENZONATATE (TESSALON) 200 MG CAPSULE    Take 1 capsule (200 mg total) by mouth at bedtime as needed for cough.

## 2018-05-05 IMAGING — CR DG KNEE COMPLETE 4+V*R*
4 series · 4 of 4 positions shown · non-contrast
Comparison: None.

CLINICAL DATA: Pain following fall

EXAM:
RIGHT KNEE - COMPLETE 4+ VIEW

[knee ap]
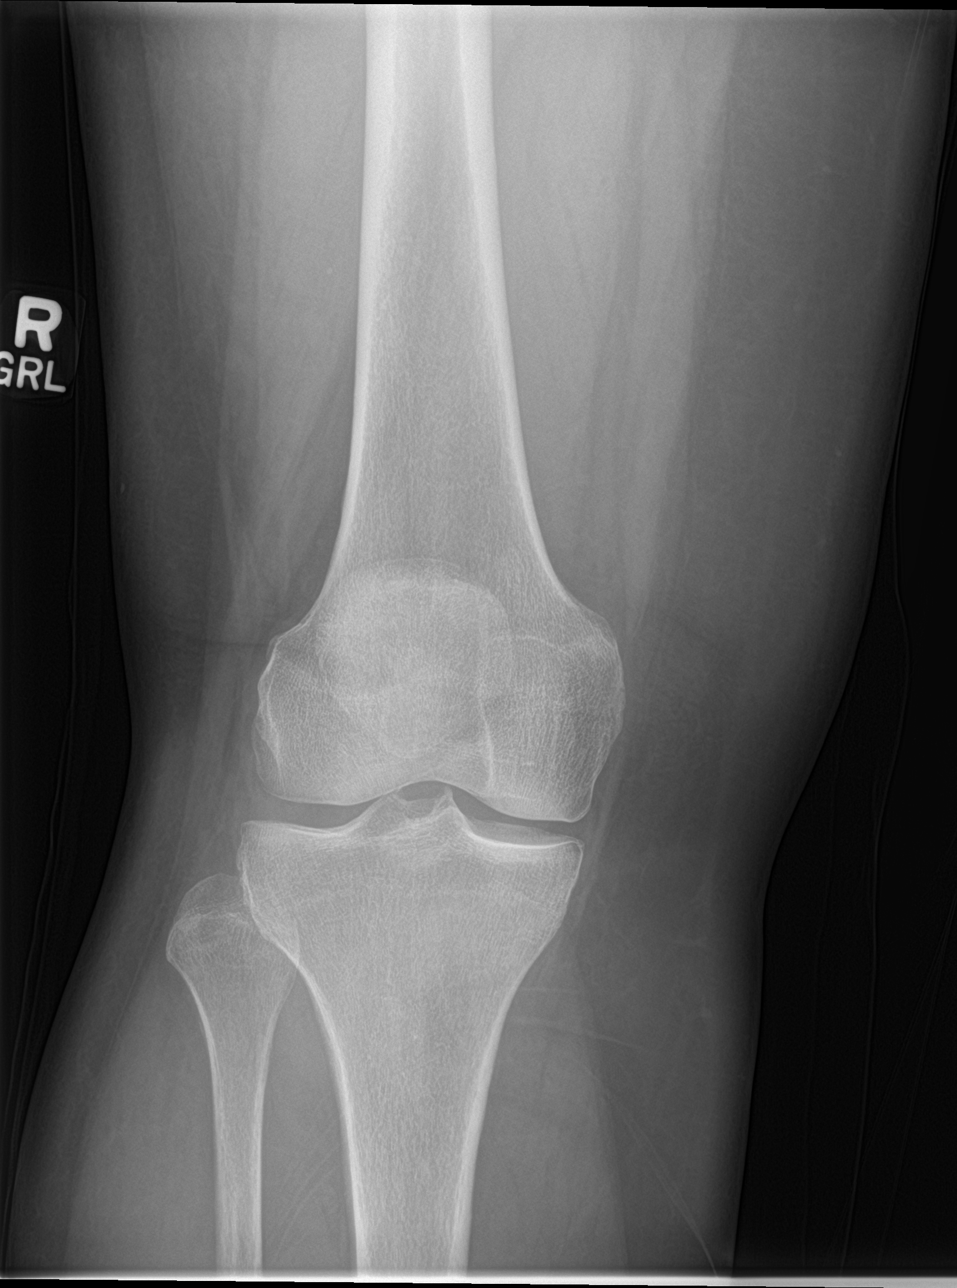

[knee obl (1 of 2)]
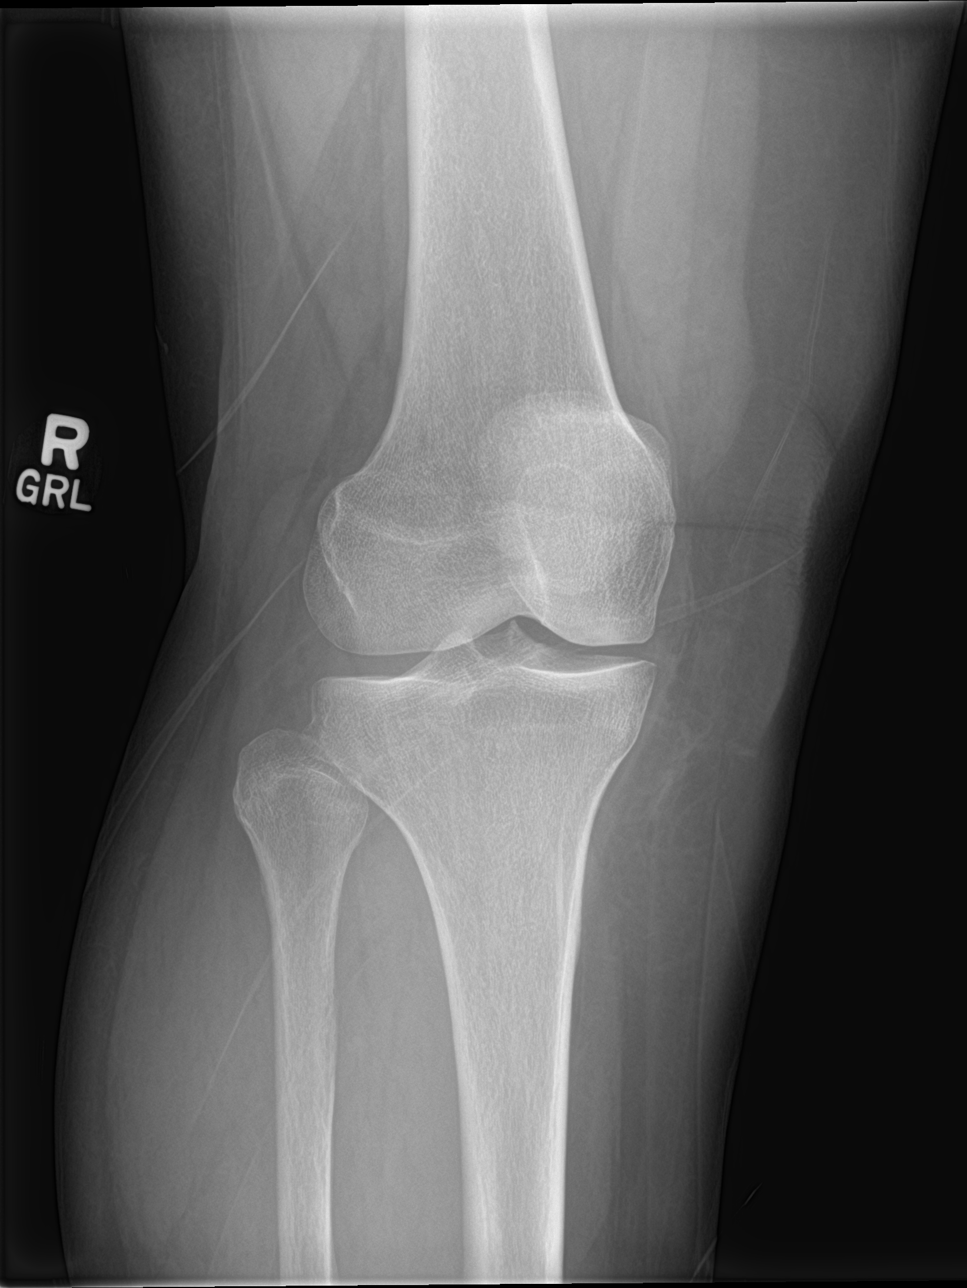

[knee obl (2 of 2)]
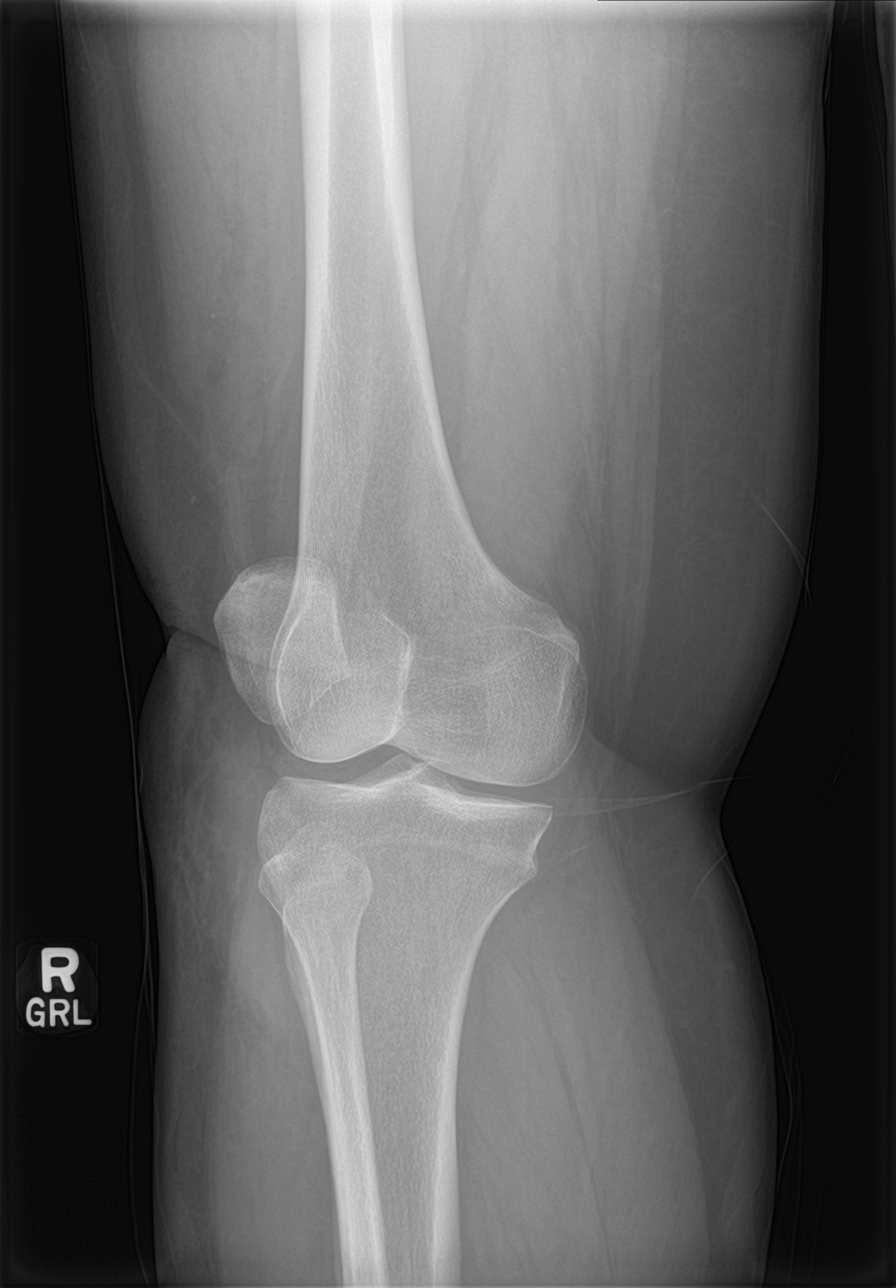

[knee lat]
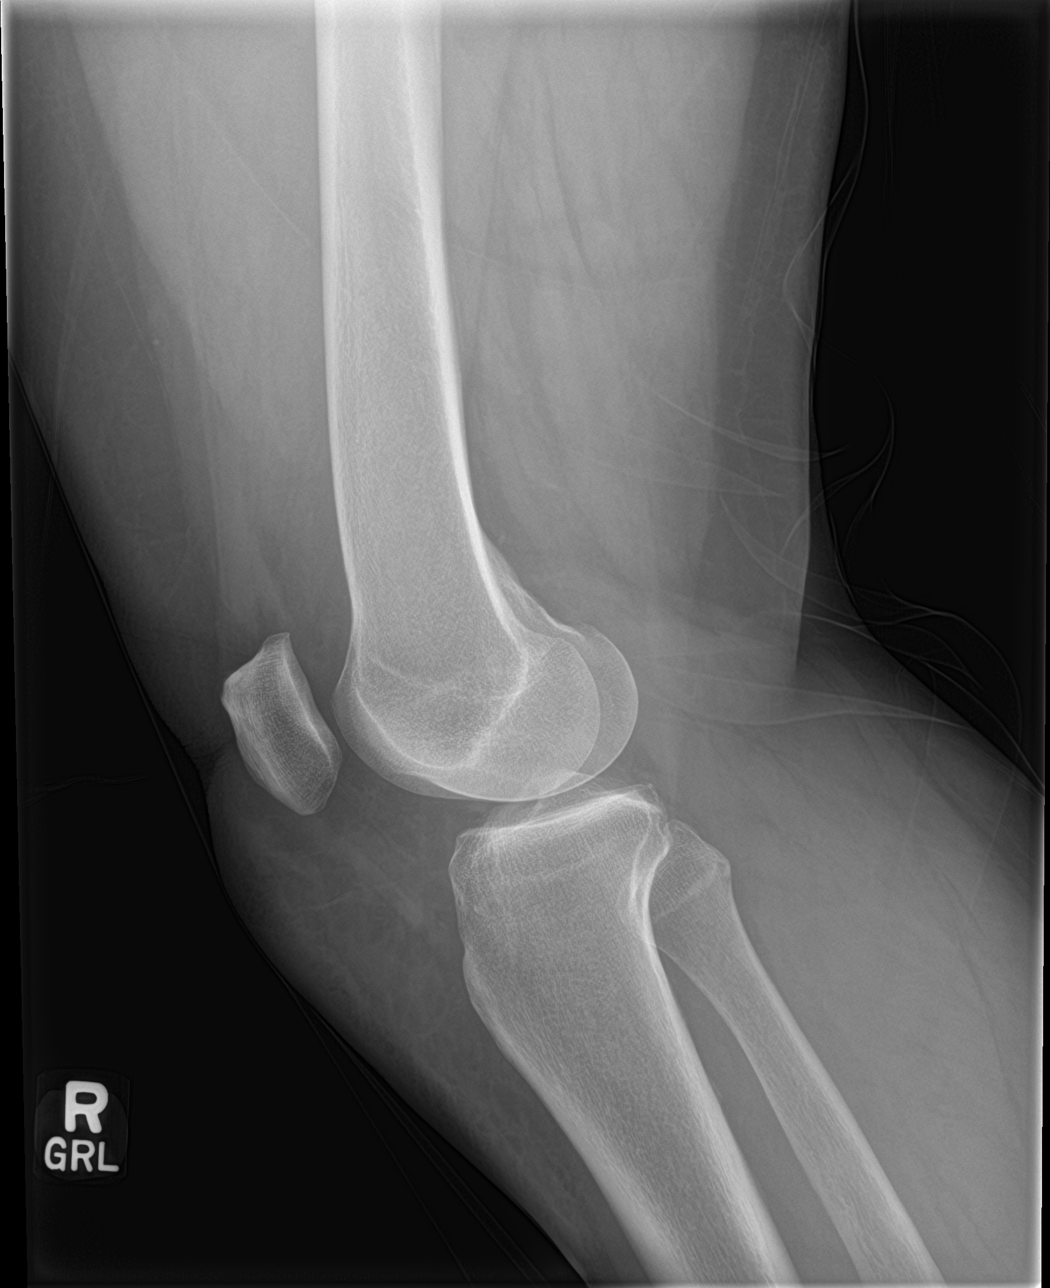

[4 of 4 positions shown; findings below may reference images not displayed]

FINDINGS: Frontal, lateral, and bilateral oblique views were obtained. There
is no evident fracture or dislocation. No joint effusion. Joint
spaces appear normal. No erosive change.
IMPRESSION: No fracture or dislocation. No joint effusion. No evident
arthropathy.

## 2018-07-16 ENCOUNTER — Ambulatory Visit: Payer: Managed Care, Other (non HMO) | Admitting: Adult Health

## 2018-07-16 ENCOUNTER — Other Ambulatory Visit: Payer: Self-pay

## 2018-07-16 ENCOUNTER — Encounter: Payer: Self-pay | Admitting: Adult Health

## 2018-07-16 VITALS — BP 138/84 | HR 81 | Resp 14 | Ht 60.0 in | Wt 185.0 lb

## 2018-07-16 DIAGNOSIS — J4520 Mild intermittent asthma, uncomplicated: Secondary | ICD-10-CM | POA: Diagnosis not present

## 2018-07-16 DIAGNOSIS — Z0189 Encounter for other specified special examinations: Secondary | ICD-10-CM | POA: Diagnosis not present

## 2018-07-16 DIAGNOSIS — R059 Cough, unspecified: Secondary | ICD-10-CM

## 2018-07-16 DIAGNOSIS — R05 Cough: Secondary | ICD-10-CM

## 2018-07-16 DIAGNOSIS — Z008 Encounter for other general examination: Secondary | ICD-10-CM

## 2018-07-16 NOTE — Patient Instructions (Signed)
Please see your primary care provider for a yearly physical and labs.  I will have the office call you on your glucose and cholesterol results when they return if you have not heard within 1 week please call the office and will have you follow up with your primary care doctor for any abnormal's. This biometric physical is not a substitute for seeing a primary care provider for a physical. Provider also recommends if you do not have a primary care provider for patient see a  primary care physician/ provider  for a routine physical and to establish primary care. Patient may chose provider of choice. Also gave the [place]  PHYSICIAN/PROVIDER  REFERRAL LINE at [phone] or web site at [email or web address] to help assist with finding a primary care doctor. Patient understands this office is acute care and no primary care is in this office.   Follow up with primary care as needed for chronic and maintenance health care- can be seen in this employee clinic for acute care.    Health Maintenance, Female Adopting a healthy lifestyle and getting preventive care can go a long way to promote health and wellness. Talk with your health care provider about what schedule of regular examinations is right for you. This is a good chance for you to check in with your provider about disease prevention and staying healthy. In between checkups, there are plenty of things you can do on your own. Experts have done a lot of research about which lifestyle changes and preventive measures are most likely to keep you healthy. Ask your health care provider for more information. Weight and diet Eat a healthy diet  Be sure to include plenty of vegetables, fruits, low-fat dairy products, and lean protein.  Do not eat a lot of foods high in solid fats, added sugars, or salt.  Get regular exercise. This is one of the most important things you can do for your health. ? Most adults should exercise for at least 150 minutes each  week. The exercise should increase your heart rate and make you sweat (moderate-intensity exercise). ? Most adults should also do strengthening exercises at least twice a week. This is in addition to the moderate-intensity exercise. Maintain a healthy weight  Body mass index (BMI) is a measurement that can be used to identify possible weight problems. It estimates body fat based on height and weight. Your health care provider can help determine your BMI and help you achieve or maintain a healthy weight.  For females 20 years of age and older: ? A BMI below 18.5 is considered underweight. ? A BMI of 18.5 to 24.9 is normal. ? A BMI of 25 to 29.9 is considered overweight. ? A BMI of 30 and above is considered obese. Watch levels of cholesterol and blood lipids  You should start having your blood tested for lipids and cholesterol at 59 years of age, then have this test every 5 years.  You may need to have your cholesterol levels checked more often if: ? Your lipid or cholesterol levels are high. ? You are older than 59 years of age. ? You are at high risk for heart disease. Cancer screening Lung Cancer  Lung cancer screening is recommended for adults 55-80 years old who are at high risk for lung cancer because of a history of smoking.  A yearly low-dose CT scan of the lungs is recommended for people who: ? Currently smoke. ? Have quit within the past 15 years. ?   Have at least a 30-pack-year history of smoking. A pack year is smoking an average of one pack of cigarettes a day for 1 year.  Yearly screening should continue until it has been 15 years since you quit.  Yearly screening should stop if you develop a health problem that would prevent you from having lung cancer treatment. Breast Cancer  Practice breast self-awareness. This means understanding how your breasts normally appear and feel.  It also means doing regular breast self-exams. Let your health care provider know about any  changes, no matter how small.  If you are in your 20s or 30s, you should have a clinical breast exam (CBE) by a health care provider every 1-3 years as part of a regular health exam.  If you are 60 or older, have a CBE every year. Also consider having a breast X-ray (mammogram) every year.  If you have a family history of breast cancer, talk to your health care provider about genetic screening.  If you are at high risk for breast cancer, talk to your health care provider about having an MRI and a mammogram every year.  Breast cancer gene (BRCA) assessment is recommended for women who have family members with BRCA-related cancers. BRCA-related cancers include: ? Breast. ? Ovarian. ? Tubal. ? Peritoneal cancers.  Results of the assessment will determine the need for genetic counseling and BRCA1 and BRCA2 testing. Cervical Cancer Your health care provider may recommend that you be screened regularly for cancer of the pelvic organs (ovaries, uterus, and vagina). This screening involves a pelvic examination, including checking for microscopic changes to the surface of your cervix (Pap test). You may be encouraged to have this screening done every 3 years, beginning at age 7.  For women ages 52-65, health care providers may recommend pelvic exams and Pap testing every 3 years, or they may recommend the Pap and pelvic exam, combined with testing for human papilloma virus (HPV), every 5 years. Some types of HPV increase your risk of cervical cancer. Testing for HPV may also be done on women of any age with unclear Pap test results.  Other health care providers may not recommend any screening for nonpregnant women who are considered low risk for pelvic cancer and who do not have symptoms. Ask your health care provider if a screening pelvic exam is right for you.  If you have had past treatment for cervical cancer or a condition that could lead to cancer, you need Pap tests and screening for cancer  for at least 20 years after your treatment. If Pap tests have been discontinued, your risk factors (such as having a new sexual partner) need to be reassessed to determine if screening should resume. Some women have medical problems that increase the chance of getting cervical cancer. In these cases, your health care provider may recommend more frequent screening and Pap tests. Colorectal Cancer  This type of cancer can be detected and often prevented.  Routine colorectal cancer screening usually begins at 59 years of age and continues through 59 years of age.  Your health care provider may recommend screening at an earlier age if you have risk factors for colon cancer.  Your health care provider may also recommend using home test kits to check for hidden blood in the stool.  A small camera at the end of a tube can be used to examine your colon directly (sigmoidoscopy or colonoscopy). This is done to check for the earliest forms of colorectal cancer.  Routine  screening usually begins at age 36.  Direct examination of the colon should be repeated every 5-10 years through 59 years of age. However, you may need to be screened more often if early forms of precancerous polyps or small growths are found. Skin Cancer  Check your skin from head to toe regularly.  Tell your health care provider about any new moles or changes in moles, especially if there is a change in a mole's shape or color.  Also tell your health care provider if you have a mole that is larger than the size of a pencil eraser.  Always use sunscreen. Apply sunscreen liberally and repeatedly throughout the day.  Protect yourself by wearing long sleeves, pants, a wide-brimmed hat, and sunglasses whenever you are outside. Heart disease, diabetes, and high blood pressure  High blood pressure causes heart disease and increases the risk of stroke. High blood pressure is more likely to develop in: ? People who have blood pressure in  the high end of the normal range (130-139/85-89 mm Hg). ? People who are overweight or obese. ? People who are African American.  If you are 24-64 years of age, have your blood pressure checked every 3-5 years. If you are 38 years of age or older, have your blood pressure checked every year. You should have your blood pressure measured twice-once when you are at a hospital or clinic, and once when you are not at a hospital or clinic. Record the average of the two measurements. To check your blood pressure when you are not at a hospital or clinic, you can use: ? An automated blood pressure machine at a pharmacy. ? A home blood pressure monitor.  If you are between 35 years and 72 years old, ask your health care provider if you should take aspirin to prevent strokes.  Have regular diabetes screenings. This involves taking a blood sample to check your fasting blood sugar level. ? If you are at a normal weight and have a low risk for diabetes, have this test once every three years after 59 years of age. ? If you are overweight and have a high risk for diabetes, consider being tested at a younger age or more often. Preventing infection Hepatitis B  If you have a higher risk for hepatitis B, you should be screened for this virus. You are considered at high risk for hepatitis B if: ? You were born in a country where hepatitis B is common. Ask your health care provider which countries are considered high risk. ? Your parents were born in a high-risk country, and you have not been immunized against hepatitis B (hepatitis B vaccine). ? You have HIV or AIDS. ? You use needles to inject street drugs. ? You live with someone who has hepatitis B. ? You have had sex with someone who has hepatitis B. ? You get hemodialysis treatment. ? You take certain medicines for conditions, including cancer, organ transplantation, and autoimmune conditions. Hepatitis C  Blood testing is recommended for: ? Everyone  born from [birth year] through 1966. ? Anyone with known risk factors for hepatitis C. Sexually transmitted infections (STIs)  You should be screened for sexually transmitted infections (STIs) including gonorrhea and chlamydia if: ? You are sexually active and are younger than 59 years of age. ? You are older than 59 years of age and your health care provider tells you that you are at risk for this type of infection. ? Your sexual activity has changed since you were  last screened and you are at an increased risk for chlamydia or gonorrhea. Ask your health care provider if you are at risk.  If you do not have HIV, but are at risk, it may be recommended that you take a prescription medicine daily to prevent HIV infection. This is called pre-exposure prophylaxis (PrEP). You are considered at risk if: ? You are sexually active and do not regularly use condoms or know the HIV status of your partner(s). ? You take drugs by injection. ? You are sexually active with a partner who has HIV. Talk with your health care provider about whether you are at high risk of being infected with HIV. If you choose to begin PrEP, you should first be tested for HIV. You should then be tested every 3 months for as long as you are taking PrEP. Pregnancy  If you are premenopausal and you may become pregnant, ask your health care provider about preconception counseling.  If you may become pregnant, take 400 to 800 micrograms (mcg) of folic acid every day.  If you want to prevent pregnancy, talk to your health care provider about birth control (contraception). Osteoporosis and menopause  Osteoporosis is a disease in which the bones lose minerals and strength with aging. This can result in serious bone fractures. Your risk for osteoporosis can be identified using a bone density scan.  If you are 69 years of age or older, or if you are at risk for osteoporosis and fractures, ask your health care provider if you should be  screened.  Ask your health care provider whether you should take a calcium or vitamin D supplement to lower your risk for osteoporosis.  Menopause may have certain physical symptoms and risks.  Hormone replacement therapy may reduce some of these symptoms and risks. Talk to your health care provider about whether hormone replacement therapy is right for you. Follow these instructions at home:  Schedule regular health, dental, and eye exams.  Stay current with your immunizations.  Do not use any tobacco products including cigarettes, chewing tobacco, or electronic cigarettes.  If you are pregnant, do not drink alcohol.  If you are breastfeeding, limit how much and how often you drink alcohol.  Limit alcohol intake to no more than 1 drink per day for nonpregnant women. One drink equals 12 ounces of beer, 5 ounces of wine, or 1 ounces of hard liquor.  Do not use street drugs.  Do not share needles.  Ask your health care provider for help if you need support or information about quitting drugs.  Tell your health care provider if you often feel depressed.  Tell your health care provider if you have ever been abused or do not feel safe at home. This information is not intended to replace advice given to you by your health care provider. Make sure you discuss any questions you have with your health care provider. Document Released: 10/30/2010 Document Revised: 09/22/2015 Document Reviewed: 01/18/2015 Elsevier Interactive Patient Education  2019 [employer].

## 2018-07-16 NOTE — Progress Notes (Addendum)
[facility]  Subjective:     Patient ID: Amanda Rowland, female   DOB: 1960-11-07, 58 y.o.   MRN: [id number]  HPI   Blood pressure 138/84, pulse 81, resp. rate 14, height 5' (1.524 m), weight 185 lb (83.9 kg), SpO2 98 %. Body mass index is 36.13 kg/m.  Leim Fabry, MD is primary care at [facility]   Patient is a  58 year old female  in no acute distress who comes to the clinic for a biometric screening with brief biometric screening physical  with labs include fasting glucose and lipids.   Allergies  Allergen Reactions  . Influenza Vaccines Swelling  . Levaquin [Levofloxacin In D5w]   . Other Other (See Comments)    Pneumonia Vaccine- caused redness/swelling in arm   Patient  denies any fever, body aches,chills, rash, chest pain, shortness of breath, nausea, vomiting, or diarrhea.  Denies any exposure to Coronavirus regions affected on map located on  on Centers for Disease Control guidelines/web site and denies any exposure to any infected persons with coronavirus given recent [place] states outbreak and [place]  state of emergency is in  effect.    Patient Active Problem List   Diagnosis Date Noted  . Abnormal screening mammogram 11/28/2016  . Hypertension 02/24/2016  . Pure hypercholesterolemia 12/08/2014  . Obesity 07/13/2013     Current Outpatient Medications:  .  albuterol (PROVENTIL HFA;VENTOLIN HFA) 108 (90 Base) MCG/ACT inhaler, Inhale 2 puffs into the lungs every 6 (six) hours as needed for wheezing or shortness of breath., Disp: 1 Inhaler, Rfl: 0 .  atenolol (TENORMIN) 100 MG tablet, TAKE 1 TABLET (100 MG TOTAL) BY MOUTH ONCE DAILY., Disp: 30 tablet, Rfl: 6 .  benzonatate (TESSALON) 200 MG capsule, Take 1 capsule (200 mg total) by mouth at bedtime as needed for cough., Disp: 20 capsule, Rfl: 0 .  hydrochlorothiazide (HYDRODIURIL) 25 MG tablet, Take 1 tablet (25 mg total) by mouth daily., Disp: 30  tablet, Rfl: 6 .  Magnesium Citrate 100 MG TABS, Take 1 tablet by mouth Nightly., Disp: , Rfl:  .  Omega-3 Fatty Acids (FISH OIL) 1000 MG CAPS, Take 1 capsule by mouth 2 (two) times daily., Disp: , Rfl:  .  sodium chloride (OCEAN) 0.65 % SOLN nasal spray, Place 2 sprays into both nostrils every 2 (two) hours while awake., Disp: , Rfl: 0 .  Vitamin D, Ergocalciferol, (DRISDOL) 50000 units CAPS capsule, Take 1 capsule (50,000 Units total) by mouth every 7 (seven) days., Disp: 12 capsule, Rfl: 3   Review of Systems  Constitutional: Negative for activity change, appetite change, chills, diaphoresis, fatigue, fever and unexpected weight change.  HENT: Negative.   Eyes: Negative for photophobia, pain, discharge, redness, itching and visual disturbance.  Respiratory: Negative.   Cardiovascular: Negative.   Gastrointestinal: Negative.   Endocrine: Negative.   Genitourinary: Negative.   Musculoskeletal: Negative.   Skin: Negative.   Allergic/Immunologic:        -- Influenza Vaccines -- Swelling  -- Levaquin (Levofloxacin In D5w)   -- Other -- Other (See Comments)   --  Pneumonia Vaccine- caused redness/swelling in arm   Neurological: Negative.   Hematological: Negative.   Psychiatric/Behavioral: Negative.        Objective:   Physical Exam Vitals signs reviewed.  Constitutional:      General: She is not in acute distress.    Appearance: Normal appearance. She is obese. She is not ill-appearing, toxic-appearing  or diaphoretic.  HENT:     Head: Normocephalic and atraumatic.     Right Ear: Tympanic membrane, ear canal and external ear normal. There is no impacted cerumen.     Left Ear: Tympanic membrane, ear canal and external ear normal. There is no impacted cerumen.     Nose: Nose normal. No congestion or rhinorrhea.     Mouth/Throat:     Mouth: Mucous membranes are moist.     Pharynx: No oropharyngeal exudate or posterior oropharyngeal erythema.     Comments:  Cobblestoning  posterior pharynx; bilateral allergic shiners; bilateral TMs air fluid level clear; bilateral nasal turbinates mild edema erythema clear discharge;    Eyes:     General: No scleral icterus.       Right eye: No discharge.        Left eye: No discharge.     Extraocular Movements: Extraocular movements intact.     Conjunctiva/sclera: Conjunctivae normal.     Pupils: Pupils are equal, round, and reactive to light.  Neck:     Musculoskeletal: Normal range of motion and neck supple. No neck rigidity or muscular tenderness.     Vascular: No carotid bruit.  Cardiovascular:     Rate and Rhythm: Normal rate and regular rhythm.     Pulses: Normal pulses.     Heart sounds: Normal heart sounds.  Pulmonary:     Effort: Pulmonary effort is normal. No respiratory distress.     Breath sounds: Normal breath sounds. No stridor. No wheezing, rhonchi or rales.  Chest:     Chest wall: No tenderness.  Abdominal:     Palpations: Abdomen is soft.  Musculoskeletal: Normal range of motion.  Lymphadenopathy:     Cervical: No cervical adenopathy.  Skin:    General: Skin is warm and dry.     Capillary Refill: Capillary refill takes less than 2 seconds.  Neurological:     Mental Status: She is alert and oriented to person, place, and time.     Gait: Gait normal.  Psychiatric:        Mood and Affect: Mood normal.        Behavior: Behavior normal.        Thought Content: Thought content normal.        Judgment: Judgment normal.        Assessment:     Encounter for biometric screening      Plan:     Follow up with primary care as needed.  Gave and reviewed After Visit Summary(AVS) with patient. Patient is advised to read the after visit summary as well and let the provider know if any question, concerns or clarifications are needed.    Please see your primary care provider for a yearly physical and labs.  I will have the office call you on your glucose and cholesterol results when they return if  you have not heard within 1 week please call the office and will have you follow up with your primary care doctor for any abnormal's. This biometric physical is not a substitute for seeing a primary care provider for a physical. Provider also recommends if you do not have a primary care provider for patient see a  primary care physician/ provider  for a routine physical and to establish primary care. Patient may chose provider of choice. Also gave the [place]  PHYSICIAN/PROVIDER  REFERRAL LINE at [phone] or web site at [email or web address] to help assist with finding a primary care  doctor. Patient understands this office is acute care and no primary care is in this office.       Follow up with primary care as needed for chronic and maintenance health care- can be seen in this employee clinic for acute care.

## 2018-07-17 ENCOUNTER — Encounter: Payer: Self-pay | Admitting: Adult Health

## 2018-07-17 LAB — LIPID PANEL WITH LDL/HDL RATIO
CHOLESTEROL TOTAL: 215 mg/dL — AB (ref 100–199)
HDL: 55 mg/dL (ref >39)
LDL Calculated: 134 mg/dL — ABNORMAL HIGH (ref 0–99)
LDL/HDL RATIO: 2.4 ratio (ref 0.0–3.2)
Triglycerides: 131 mg/dL (ref 0–149)
VLDL Cholesterol Cal: 26 mg/dL (ref 5–40)

## 2018-07-17 LAB — GLUCOSE, RANDOM

## 2018-07-17 NOTE — Progress Notes (Signed)
We will retest both 07/18/18 due to lab error.

## 2018-07-18 ENCOUNTER — Other Ambulatory Visit: Payer: Managed Care, Other (non HMO)

## 2018-07-18 ENCOUNTER — Other Ambulatory Visit: Payer: Self-pay

## 2018-07-18 DIAGNOSIS — Z0189 Encounter for other specified special examinations: Secondary | ICD-10-CM | POA: Diagnosis not present

## 2018-07-18 DIAGNOSIS — Z008 Encounter for other general examination: Secondary | ICD-10-CM

## 2018-07-19 LAB — LIPID PANEL WITH LDL/HDL RATIO
Cholesterol, Total: 212 mg/dL — ABNORMAL HIGH (ref 100–199)
HDL: 56 mg/dL (ref >39)
LDL Calculated: 136 mg/dL — ABNORMAL HIGH (ref 0–99)
LDl/HDL Ratio: 2.4 ratio (ref 0.0–3.2)
Triglycerides: 98 mg/dL (ref 0–149)
VLDL Cholesterol Cal: 20 mg/dL (ref 5–40)

## 2018-07-19 LAB — GLUCOSE, RANDOM: GLUCOSE: 91 mg/dL (ref 65–99)

## 2018-07-21 NOTE — Progress Notes (Signed)
Please let patient know her total cholesterol is 212 ( elevated normal range is 100-199) , LDL 136 elevated ( 0-99), Triglycerides elevated 2 years ago and now is normal ! still recommend patient following recommended diet as sent in my chart and follow up with primary care provider in 2-3 months given recent healthcare emergency.

## 2018-07-22 ENCOUNTER — Encounter: Payer: Self-pay | Admitting: Adult Health

## 2018-07-31 ENCOUNTER — Encounter: Payer: Self-pay | Admitting: Adult Health

## 2018-07-31 MED ORDER — ALBUTEROL SULFATE HFA 108 (90 BASE) MCG/ACT IN AERS: 1.0000 | INHALATION_SPRAY | Freq: Four times a day (QID) | RESPIRATORY_TRACT | 1 refills | Status: AC | PRN

## 2018-07-31 NOTE — Progress Notes (Addendum)
No office visit- not in person  Patient request refill for seasonal allergy history - denies any symptoms now but would like on hand.   Meds ordered this encounter  Medications  . albuterol (PROVENTIL HFA;VENTOLIN HFA) 108 (90 Base) MCG/ACT inhaler    Sig: Inhale 1-2 puffs into the lungs every 6 (six) hours as needed for wheezing or shortness of breath. For seasonal allergies    Dispense:  1 Inhaler    Refill:  1

## 2018-07-31 NOTE — Addendum Note (Signed)
Addended by: Berniece Pap on: 07/31/2018 12:52 PM   Modules accepted: Orders

## 2018-08-20 ENCOUNTER — Encounter: Payer: Self-pay | Admitting: Adult Health

## 2018-08-20 ENCOUNTER — Ambulatory Visit: Payer: Managed Care, Other (non HMO) | Admitting: Adult Health

## 2018-08-20 ENCOUNTER — Other Ambulatory Visit: Payer: Self-pay

## 2018-08-20 DIAGNOSIS — R319 Hematuria, unspecified: Secondary | ICD-10-CM | POA: Diagnosis not present

## 2018-08-20 DIAGNOSIS — N390 Urinary tract infection, site not specified: Secondary | ICD-10-CM

## 2018-08-20 MED ORDER — SULFAMETHOXAZOLE-TRIMETHOPRIM 800-160 MG PO TABS: 1.0000 | ORAL_TABLET | Freq: Two times a day (BID) | ORAL | 0 refills | Status: AC

## 2018-08-20 NOTE — Patient Instructions (Signed)
Sent to patient's MYCHART.

## 2018-08-20 NOTE — Progress Notes (Signed)
Virtual Visit via Telephone Note  I connected with Amanda Rowland on 08/20/18 at  1:00 PM EDT by telephone and verified that I am speaking with the correct person using two identifiers.   I discussed the limitations, risks, security and privacy concerns of performing an evaluation and management service by telephone and the availability of in person appointments. I also discussed with the patient that there may be a patient responsible charge related to this service. The patient expressed understanding and agreed to proceed.  Patient prefers tele health due to COVID 19 pandemic.  History of Present Illness:( No vitals with virtual visit- patient unable to take does not have fever she reports.  Patient called the office with two days of dysuria, mild lower back pain. She saw a small tinge of blood with wiping. Patient  denies any fever, body aches,chills, rash, chest pain, shortness of breath, nausea, vomiting, or diarrhea.  Denies any other symptoms.   Denies any history of recurrent urinary tract infections.  She denies abdominal pain.  Urinating normal amounts.  Observations/Objective:  Patient is alert and oriented and responsive to questions Engages normal conversation with provider. Speaks in full sentences without any pauses without any shortness of breath or distress.  No stridor.    Assessment and Plan:  Diagnoses and all orders for this visit:  Urinary tract infection with hematuria, site unspecified  Other orders -     sulfamethoxazole-trimethoprim (BACTRIM DS) 800-160 MG tablet; Take 1 tablet by mouth 2 (two) times daily.     Follow Up Instructions:   Educated on RED FLAGS and signs of pyelonephritis and when to seek emergency medical attention.  I discussed the assessment and treatment plan with the patient. The patient was provided an opportunity to ask questions and all were answered. The patient agreed with the plan and demonstrated an understanding of the  instructions.   The patient was advised to call back or seek an in-person evaluation if the symptoms worsen or if the condition fails to improve as anticipated.  I provided 25 minutes of non-face-to-face time during this encounter.   Jairo Ben, FNP

## 2018-08-21 NOTE — Addendum Note (Signed)
Addended by: FLINCHUM, MICHELLE S on: 08/21/2018 11:30 AM   Modules accepted: Level of Service  

## 2018-08-21 NOTE — Addendum Note (Signed)
Addended by: Berniece Pap on: 08/21/2018 11:34 AM   Modules accepted: Level of Service

## 2018-08-21 NOTE — Addendum Note (Signed)
Addended by: Berniece Pap on: 08/21/2018 11:32 AM   Modules accepted: Level of Service

## 2019-08-12 ENCOUNTER — Other Ambulatory Visit: Payer: Self-pay

## 2019-08-12 ENCOUNTER — Ambulatory Visit: Payer: Managed Care, Other (non HMO) | Admitting: Emergency Medicine

## 2019-08-12 VITALS — BP 126/76 | HR 60 | Temp 97.4°F | Resp 16 | Ht 60.0 in | Wt 176.0 lb

## 2019-08-12 DIAGNOSIS — Z008 Encounter for other general examination: Secondary | ICD-10-CM

## 2019-08-12 NOTE — Progress Notes (Signed)
____________________________________________   None    (approximate)  I have reviewed the triage vital signs and the nursing notes.   HISTORY  Chief Complaint Biometrics   HPI Amanda Rowland is a 59 y.o. female presents to the clinic for biometric physical.  No complaints.  Patient is currently working on reducing her weight after gaining during Covid. Already she is down over 10 pounds.      Past Medical History:  Diagnosis Date  . Hypertension     Patient Active Problem List   Diagnosis Date Noted  . Abnormal screening mammogram 11/28/2016  . Hypertension 02/24/2016  . Pure hypercholesterolemia 12/08/2014  . Obesity 07/13/2013    No past surgical history on file.  Prior to Admission medications   Medication Sig Start Date End Date Taking? Authorizing Provider  atenolol (TENORMIN) 100 MG tablet TAKE 1 TABLET (100 MG TOTAL) BY MOUTH ONCE DAILY. 02/24/16  Yes Fisher, Roselyn Bering, PA-C  zinc gluconate 50 MG tablet Take 50 mg by mouth daily.   Yes [provider]  albuterol (PROVENTIL HFA;VENTOLIN HFA) 108 (90 Base) MCG/ACT inhaler Inhale 1-2 puffs into the lungs every 6 (six) hours as needed for wheezing or shortness of breath. For seasonal allergies 07/31/18   Flinchum, Eula Fried, FNP  benzonatate (TESSALON) 200 MG capsule Take 1 capsule (200 mg total) by mouth at bedtime as needed for cough. Patient not taking: Reported on 08/12/2019 11/04/17   Ralene Muskrat, FNP  hydrochlorothiazide (HYDRODIURIL) 25 MG tablet Take 1 tablet (25 mg total) by mouth daily. Patient not taking: Reported on 08/12/2019 02/24/16   Faythe Ghee, PA-C  Magnesium Citrate 100 MG TABS Take 1 tablet by mouth Nightly.    [provider]  Omega-3 Fatty Acids (FISH OIL) 1000 MG CAPS Take 1 capsule by mouth 2 (two) times daily.    [provider]  sodium chloride (OCEAN) 0.65 % SOLN nasal spray Place 2 sprays into both nostrils every 2 (two) hours while awake. 05/15/17 06/14/17   Betancourt, Jarold Song, NP  sulfamethoxazole-trimethoprim (BACTRIM DS) 800-160 MG tablet Take 1 tablet by mouth 2 (two) times daily. 08/20/18   Flinchum, Eula Fried, FNP  Vitamin D, Ergocalciferol, (DRISDOL) 50000 units CAPS capsule Take 1 capsule (50,000 Units total) by mouth every 7 (seven) days. 03/08/16   Faythe Ghee, PA-C    Allergies Influenza vaccines, Levaquin [levofloxacin in d5w], and Other  No family history on file.  Social History Social History   Tobacco Use  . Smoking status: Never Smoker  . Smokeless tobacco: Never Used  Substance Use Topics  . Alcohol use: No    Alcohol/week: 0.0 standard drinks  . Drug use: Not on file    Review of Systems Constitutional: No fever/chills Eyes: No visual changes. ENT: No sore throat. Cardiovascular: Denies chest pain. Respiratory: Denies shortness of breath. Gastrointestinal: No abdominal pain.  Musculoskeletal: Negative for joint pains. Skin: Negative for rash. Neurological: Negative for headaches, focal weakness or numbness. ____________________________________________   PHYSICAL EXAM:  VITAL SIGNS: Reviewed per nursing Constitutional: Alert and oriented. Well appearing and in no acute distress. Eyes: Conjunctivae are normal. PERRL. EOMI. Head: Atraumatic. Nose: No congestion/rhinnorhea. Mouth/Throat: Mask in place Neck: No stridor.  No cervical tenderness on palpation posteriorly.  No difficulty with range of motion. Hematological/Lymphatic/Immunilogical: No cervical lymphadenopathy. Cardiovascular: Normal rate, regular rhythm. Grossly normal heart sounds.  Good peripheral circulation. Respiratory: Normal respiratory effort.  No retractions. Lungs CTAB. Gastrointestinal: Soft and nontender. No distention.  Bowel sounds  normoactive x4 quadrants. Musculoskeletal: Moves upper and lower extremities without any difficulty.  There is no point tenderness on palpation of the thoracic or lumbar spine.  No lower extremity  tenderness nor edema.  Normal gait was noted. Neurologic:  Normal speech and language. No gross focal neurologic deficits are appreciated. No gait instability. Skin:  Skin is warm, dry and intact. No rash noted. Psychiatric: Mood and affect are normal. Speech and behavior are normal.  ____________________________________________   LABS  pending   INITIAL IMPRESSION / ASSESSMENT AND PLAN Patient will continue to work on her weight.   ____________________________________________   FINAL CLINICAL IMPRESSION(S)   Healthy physical exam for Biometrics.  Labs are pending   ED Discharge Orders         Ordered    Lipid panel     Pending    Glucose, random     Pending           Note:  This document was prepared using Dragon voice recognition software and may include unintentional dictation errors.

## 2019-08-13 LAB — GLUCOSE, RANDOM

## 2019-08-13 LAB — LIPID PANEL
Chol/HDL Ratio: 4.2 ratio (ref 0.0–4.4)
Cholesterol, Total: 186 mg/dL (ref 100–199)
HDL: 44 mg/dL (ref >39)
LDL Chol Calc (NIH): 124 mg/dL — ABNORMAL HIGH (ref 0–99)
Triglycerides: 100 mg/dL (ref 0–149)
VLDL Cholesterol Cal: 18 mg/dL (ref 5–40)

## 2019-08-14 LAB — GLUCOSE, RANDOM: Glucose: 95 mg/dL (ref 65–99)
